# Patient Record
Sex: Male | Born: 1980 | Race: White | Hispanic: No | Marital: Married | Smoking: Current every day smoker
Health system: Southern US, Community
[De-identification: ages and names within clinical notes are randomized; demographics above are authoritative.]

## PROBLEM LIST (undated history)

## (undated) DIAGNOSIS — Z720 Tobacco use: Secondary | ICD-10-CM

## (undated) DIAGNOSIS — Z789 Other specified health status: Secondary | ICD-10-CM

## (undated) HISTORY — PX: NO PAST SURGERIES: SHX2092

---

## 2005-08-16 ENCOUNTER — Emergency Department (HOSPITAL_COMMUNITY): Admission: EM | Admit: 2005-08-16 | Discharge: 2005-08-16 | Payer: Self-pay | Admitting: Emergency Medicine

## 2007-11-15 ENCOUNTER — Emergency Department (HOSPITAL_COMMUNITY): Admission: EM | Admit: 2007-11-15 | Discharge: 2007-11-15 | Payer: Self-pay | Admitting: Emergency Medicine

## 2014-04-26 ENCOUNTER — Inpatient Hospital Stay (HOSPITAL_COMMUNITY)
Admission: EM | Admit: 2014-04-26 | Discharge: 2014-04-30 | DRG: 392 | Disposition: A | Discharge status: Home / Self Care | Payer: Self-pay | Attending: Surgery | Admitting: Surgery

## 2014-04-26 ENCOUNTER — Encounter (HOSPITAL_COMMUNITY): Payer: Self-pay | Admitting: Emergency Medicine

## 2014-04-26 ENCOUNTER — Emergency Department (HOSPITAL_COMMUNITY): Payer: Self-pay

## 2014-04-26 DIAGNOSIS — F1290 Cannabis use, unspecified, uncomplicated: Secondary | ICD-10-CM | POA: Diagnosis present

## 2014-04-26 DIAGNOSIS — F17210 Nicotine dependence, cigarettes, uncomplicated: Secondary | ICD-10-CM | POA: Diagnosis present

## 2014-04-26 DIAGNOSIS — Z720 Tobacco use: Secondary | ICD-10-CM

## 2014-04-26 DIAGNOSIS — D72829 Elevated white blood cell count, unspecified: Secondary | ICD-10-CM | POA: Diagnosis present

## 2014-04-26 DIAGNOSIS — K5720 Diverticulitis of large intestine with perforation and abscess without bleeding: Principal | ICD-10-CM | POA: Diagnosis present

## 2014-04-26 DIAGNOSIS — R109 Unspecified abdominal pain: Secondary | ICD-10-CM

## 2014-04-26 HISTORY — DX: Tobacco use: Z72.0

## 2014-04-26 HISTORY — DX: Other specified health status: Z78.9

## 2014-04-26 LAB — COMPREHENSIVE METABOLIC PANEL
ALBUMIN: 4.2 g/dL (ref 3.5–5.2)
ALK PHOS: 70 U/L (ref 39–117)
ALT: 24 U/L (ref 0–53)
AST: 16 U/L (ref 0–37)
Anion gap: 12 (ref 5–15)
BILIRUBIN TOTAL: 1.9 mg/dL — AB (ref 0.3–1.2)
BUN: 14 mg/dL (ref 6–23)
CHLORIDE: 103 meq/L (ref 96–112)
CO2: 24 mmol/L (ref 19–32)
CREATININE: 0.9 mg/dL (ref 0.50–1.35)
Calcium: 9.6 mg/dL (ref 8.4–10.5)
GFR calc non Af Amer: >90 mL/min (ref >90)
GLUCOSE: 104 mg/dL — AB (ref 70–99)
Potassium: 3.8 mmol/L (ref 3.5–5.1)
SODIUM: 139 mmol/L (ref 135–145)
TOTAL PROTEIN: 8 g/dL (ref 6.0–8.3)

## 2014-04-26 LAB — CBC WITH DIFFERENTIAL/PLATELET
Basophils Absolute: 0 10*3/uL (ref 0.0–0.1)
Basophils Relative: 0 % (ref 0–1)
Eosinophils Absolute: 0 10*3/uL (ref 0.0–0.7)
Eosinophils Relative: 0 % (ref 0–5)
HCT: 48.7 % (ref 39.0–52.0)
HEMOGLOBIN: 17.2 g/dL — AB (ref 13.0–17.0)
Lymphocytes Relative: 6 % — ABNORMAL LOW (ref 12–46)
Lymphs Abs: 1.4 10*3/uL (ref 0.7–4.0)
MCH: 30.7 pg (ref 26.0–34.0)
MCHC: 35.3 g/dL (ref 30.0–36.0)
MCV: 87 fL (ref 78.0–100.0)
Monocytes Absolute: 1.3 10*3/uL — ABNORMAL HIGH (ref 0.1–1.0)
Monocytes Relative: 6 % (ref 3–12)
NEUTROS ABS: 19.7 10*3/uL — AB (ref 1.7–7.7)
NEUTROS PCT: 88 % — AB (ref 43–77)
Platelets: 291 10*3/uL (ref 150–400)
RBC: 5.6 MIL/uL (ref 4.22–5.81)
RDW: 12.1 % (ref 11.5–15.5)
WBC: 22.5 10*3/uL — AB (ref 4.0–10.5)

## 2014-04-26 LAB — URINALYSIS, ROUTINE W REFLEX MICROSCOPIC
Glucose, UA: NEGATIVE mg/dL
HGB URINE DIPSTICK: NEGATIVE
KETONES UR: 40 mg/dL — AB
LEUKOCYTES UA: NEGATIVE
NITRITE: NEGATIVE
PH: 5.5 (ref 5.0–8.0)
Protein, ur: NEGATIVE mg/dL
Specific Gravity, Urine: >1.046 — ABNORMAL HIGH (ref 1.005–1.030)
UROBILINOGEN UA: 1 mg/dL (ref 0.0–1.0)

## 2014-04-26 LAB — LIPASE, BLOOD: Lipase: 20 U/L (ref 11–59)

## 2014-04-26 MED ORDER — CIPROFLOXACIN IN D5W 400 MG/200ML IV SOLN
400.0000 mg | Freq: Once | INTRAVENOUS | Status: AC
  Administered 2014-04-26: 400 mg via INTRAVENOUS
  Filled 2014-04-26: qty 200

## 2014-04-26 MED ORDER — MORPHINE SULFATE 4 MG/ML IJ SOLN: 4.0000 mg | INTRAMUSCULAR | Status: DC | PRN

## 2014-04-26 MED ORDER — HYDROMORPHONE HCL 1 MG/ML IJ SOLN
1.0000 mg | Freq: Once | INTRAMUSCULAR | Status: AC
  Administered 2014-04-26: 1 mg via INTRAVENOUS
  Filled 2014-04-26: qty 1

## 2014-04-26 MED ORDER — METRONIDAZOLE IN NACL 5-0.79 MG/ML-% IV SOLN
500.0000 mg | Freq: Once | INTRAVENOUS | Status: AC
  Administered 2014-04-26: 500 mg via INTRAVENOUS
  Filled 2014-04-26: qty 100

## 2014-04-26 MED ORDER — IOHEXOL 300 MG/ML  SOLN
50.0000 mL | Freq: Once | INTRAMUSCULAR | Status: AC | PRN
  Administered 2014-04-26: 50 mL via ORAL

## 2014-04-26 MED ORDER — ONDANSETRON HCL 4 MG/2ML IJ SOLN
4.0000 mg | Freq: Four times a day (QID) | INTRAMUSCULAR | Status: DC | PRN
Start: 2014-04-26 — End: 2014-04-30

## 2014-04-26 MED ORDER — CIPROFLOXACIN IN D5W 400 MG/200ML IV SOLN: 400.0000 mg | Freq: Two times a day (BID) | INTRAVENOUS | Status: DC

## 2014-04-26 MED ORDER — PANTOPRAZOLE SODIUM 40 MG IV SOLR
40.0000 mg | Freq: Every day | INTRAVENOUS | Status: DC
  Administered 2014-04-26 – 2014-04-28 (×3): 40 mg via INTRAVENOUS
  Filled 2014-04-26 (×4): qty 40

## 2014-04-26 MED ORDER — HYDROMORPHONE HCL 1 MG/ML IJ SOLN
1.0000 mg | INTRAMUSCULAR | Status: DC | PRN
  Administered 2014-04-26 – 2014-04-27 (×4): 1 mg via INTRAVENOUS
  Filled 2014-04-26 (×5): qty 1

## 2014-04-26 MED ORDER — ONDANSETRON HCL 4 MG/2ML IJ SOLN
4.0000 mg | Freq: Once | INTRAMUSCULAR | Status: AC
  Administered 2014-04-26: 4 mg via INTRAVENOUS
  Filled 2014-04-26: qty 2

## 2014-04-26 MED ORDER — SODIUM CHLORIDE 0.9 % IV BOLUS (SEPSIS)
1000.0000 mL | Freq: Once | INTRAVENOUS | Status: AC
  Administered 2014-04-26: 1000 mL via INTRAVENOUS

## 2014-04-26 MED ORDER — CIPROFLOXACIN IN D5W 400 MG/200ML IV SOLN
400.0000 mg | Freq: Two times a day (BID) | INTRAVENOUS | Status: DC
  Administered 2014-04-27 – 2014-04-30 (×7): 400 mg via INTRAVENOUS
  Filled 2014-04-26 (×7): qty 200

## 2014-04-26 MED ORDER — METRONIDAZOLE IN NACL 5-0.79 MG/ML-% IV SOLN
500.0000 mg | Freq: Three times a day (TID) | INTRAVENOUS | Status: DC
  Administered 2014-04-27 – 2014-04-30 (×10): 500 mg via INTRAVENOUS
  Filled 2014-04-26 (×11): qty 100

## 2014-04-26 MED ORDER — IOHEXOL 300 MG/ML  SOLN
100.0000 mL | Freq: Once | INTRAMUSCULAR | Status: AC | PRN
  Administered 2014-04-26: 100 mL via INTRAVENOUS

## 2014-04-26 MED ORDER — METRONIDAZOLE IN NACL 5-0.79 MG/ML-% IV SOLN: 500.0000 mg | Freq: Three times a day (TID) | INTRAVENOUS | Status: DC

## 2014-04-26 MED ORDER — POTASSIUM CHLORIDE IN NACL 20-0.9 MEQ/L-% IV SOLN
INTRAVENOUS | Status: DC
  Administered 2014-04-26 – 2014-04-29 (×4): via INTRAVENOUS
  Filled 2014-04-26 (×12): qty 1000

## 2014-04-26 MED ORDER — ENOXAPARIN SODIUM 40 MG/0.4ML ~~LOC~~ SOLN
40.0000 mg | Freq: Every day | SUBCUTANEOUS | Status: DC
  Administered 2014-04-26 – 2014-04-29 (×4): 40 mg via SUBCUTANEOUS
  Filled 2014-04-26 (×6): qty 0.4

## 2014-04-26 NOTE — Progress Notes (Signed)
Utilization Review completed.  Dondi Aime RN CM  

## 2014-04-26 NOTE — ED Provider Notes (Signed)
CSN: [id number]     Arrival date & time 04/26/14  1149 History   First MD Initiated Contact with Patient 04/26/14 1456     Chief Complaint  Patient presents with  . Abdominal Pain     (Consider location/radiation/quality/duration/timing/severity/associated sxs/prior Treatment) Patient is a 34 y.o. male presenting with abdominal pain. The history is provided by the patient.  Abdominal Pain Associated symptoms: nausea   Associated symptoms: no chest pain, no chills, no constipation, no cough, no diarrhea, no dysuria, no fever, no shortness of breath, no sore throat and no vomiting   pt c/o lower abd pain for the past 2 days. Constant. Dull. Moderate-sever. Non radiating. Worse w movement and coughing. Decreased appetite, nausea. No vomiting. Had small bm today. No constipation. Denies fever or chills. No hx similar symptoms in past. No prior abd surgery.   History reviewed. No pertinent past medical history. History reviewed. No pertinent past surgical history. History reviewed. No pertinent family history. History  Substance Use Topics  . Smoking status: Current Every Day Smoker -- 0.01 packs/day    Types: Cigarettes  . Smokeless tobacco: Not on file  . Alcohol Use: Yes     Comment: occasionally    Review of Systems  Constitutional: Negative for fever and chills.  HENT: Negative for sore throat.   Eyes: Negative for redness.  Respiratory: Negative for cough and shortness of breath.   Cardiovascular: Negative for chest pain.  Gastrointestinal: Positive for nausea and abdominal pain. Negative for vomiting, diarrhea and constipation.  Endocrine: Negative for polyuria.  Genitourinary: Negative for dysuria, flank pain, scrotal swelling and testicular pain.  Musculoskeletal: Negative for back pain and neck pain.  Skin: Negative for rash.  Neurological: Negative for headaches.  Hematological: Does not bruise/bleed easily.  Psychiatric/Behavioral: Negative for confusion.       Allergies  Review of patient's allergies indicates no known allergies.  Home Medications   Prior to Admission medications   Not on File   BP 126/78 mmHg  Pulse 96  Temp(Src) 98.9 F (37.2 C) (Oral)  Resp 16  SpO2 97% Physical Exam  Constitutional: He is oriented to person, place, and time. He appears well-developed and well-nourished. No distress.  HENT:  Head: Atraumatic.  Mouth/Throat: Oropharynx is clear and moist.  Eyes: Conjunctivae are normal. No scleral icterus.  Neck: Neck supple. No tracheal deviation present.  Cardiovascular: Normal rate, regular rhythm, normal heart sounds and intact distal pulses.  Exam reveals no gallop and no friction rub.   No murmur heard. Pulmonary/Chest: Effort normal and breath sounds normal. No accessory muscle usage. No respiratory distress.  Abdominal: Soft. He exhibits no distension and no mass. There is tenderness. There is no rebound and no guarding.  Moderate suprapubic, and RLQ tenderness. Guards.   Genitourinary:  No scrotal or testicular pain, swelling or tenderness.   Musculoskeletal: Normal range of motion. He exhibits no edema or tenderness.  Neurological: He is alert and oriented to person, place, and time.  Skin: Skin is warm and dry. No rash noted. He is not diaphoretic.  Psychiatric: He has a normal mood and affect.  Nursing note and vitals reviewed.   ED Course  Procedures (including critical care time) Labs Review   Results for orders placed or performed during the hospital encounter of 04/26/14  CBC with Differential  Result Value Ref Range   WBC 22.5 (H) 4.0 - 10.5 K/uL   RBC 5.60 4.22 - 5.81 MIL/uL   Hemoglobin 17.2 (H) 13.0 - 17.0  g/dL   HCT 45.448.7 09.839.0 - 11.952.0 %   MCV 87.0 78.0 - 100.0 fL   MCH 30.7 26.0 - 34.0 pg   MCHC 35.3 30.0 - 36.0 g/dL   RDW 14.712.1 82.911.5 - 56.215.5 %   Platelets 291 150 - 400 K/uL   Neutrophils Relative % 88 (H) 43 - 77 %   Neutro Abs 19.7 (H) 1.7 - 7.7 K/uL   Lymphocytes Relative  6 (L) 12 - 46 %   Lymphs Abs 1.4 0.7 - 4.0 K/uL   Monocytes Relative 6 3 - 12 %   Monocytes Absolute 1.3 (H) 0.1 - 1.0 K/uL   Eosinophils Relative 0 0 - 5 %   Eosinophils Absolute 0.0 0.0 - 0.7 K/uL   Basophils Relative 0 0 - 1 %   Basophils Absolute 0.0 0.0 - 0.1 K/uL  Comprehensive metabolic panel  Result Value Ref Range   Sodium 139 135 - 145 mmol/L   Potassium 3.8 3.5 - 5.1 mmol/L   Chloride 103 96 - 112 mEq/L   CO2 24 19 - 32 mmol/L   Glucose, Bld 104 (H) 70 - 99 mg/dL   BUN 14 6 - 23 mg/dL   Creatinine, Ser 1.300.90 0.50 - 1.35 mg/dL   Calcium 9.6 8.4 - 86.510.5 mg/dL   Total Protein 8.0 6.0 - 8.3 g/dL   Albumin 4.2 3.5 - 5.2 g/dL   AST 16 0 - 37 U/L   ALT 24 0 - 53 U/L   Alkaline Phosphatase 70 39 - 117 U/L   Total Bilirubin 1.9 (H) 0.3 - 1.2 mg/dL   GFR calc non Af Amer >90 >90 mL/min   GFR calc Af Amer >90 >90 mL/min   Anion gap 12 5 - 15  Lipase, blood  Result Value Ref Range   Lipase 20 11 - 59 U/L   Ct Abdomen Pelvis W Contrast  04/26/2014   CLINICAL DATA:  Left lower quadrant intermittent abdominal pain began yesterday morning. Symptoms worse today. No nausea, vomiting, diarrhea. When he coughs pain radiates to the rectum.  EXAM: CT ABDOMEN AND PELVIS WITH CONTRAST  TECHNIQUE: Multidetector CT imaging of the abdomen and pelvis was performed using the standard protocol following bolus administration of intravenous contrast.  CONTRAST:  50mL OMNIPAQUE IOHEXOL 300 MG/ML SOLN, 100mL OMNIPAQUE IOHEXOL 300 MG/ML SOLN  COMPARISON:  None.  FINDINGS: Lower chest: Lung bases are unremarkable.  Heart size is normal.  Upper abdomen: No focal abnormality identified within the liver, spleen, pancreas, or adrenal glands. Lower pole cyst is identified the left kidney. Right kidney has a normal appearance. The gallbladder is present.  Gastrointestinal tract: The stomach and small bowel loops are normal in appearance. The appendix is well seen and has a normal appearance. Numerous colonic  diverticula are present. There is inflammatory stranding within the sigmoid colon. Probable focal perforation is identified adjacent to the sigmoid colon, consistent with acute diverticulitis. No associated abscess.  Pelvis: Urinary bladder, prostate, and seminal vesicles have a normal appearance.  Retroperitoneum: No retroperitoneal or mesenteric adenopathy.  Abdominal wall: Unremarkable.  Osseous structures: No suspicious lytic or blastic lesions are identified.  IMPRESSION: 1. Findings consistent with sigmoid diverticulitis and focal perforation. No associated abscess. 2. Left renal cyst. 3. Normal appendix.   Electronically Signed   By: Rosalie GumsBeth  Brown M.D.   On: 04/26/2014 17:14      MDM   Iv ns bolus. Labs.  Dilaudid 1 mg iv. zofran iv.  Ct.  Reviewed nursing notes and  prior charts for additional history.   Ct results noted. Discussed w pt.    cipro and flagyl iv.  gen surgery consulted. Admit.  Recheck pt, more comfortable. bp normal. Pain improved.     Suzi Roots, MD 04/26/14 [phone]

## 2014-04-26 NOTE — ED Notes (Signed)
Pt c/o severe LLQ intermittent abdominal pain onset yesterday morning, worsening today. Denies n/v/diarrhea. Pt states that when he coughs, pain radiates to rectum.

## 2014-04-26 NOTE — Progress Notes (Signed)
  CARE MANAGEMENT ED NOTE 04/26/2014  Patient:  Skyles,Dresean   Account Number:  [id number]  Date Initiated:  04/26/2014  Documentation initiated by:  Radford PaxFERRERO,Kie Calvin  Subjective/Objective Assessment:   Patient presents to Ed with abdominal pain mainly in  left lower quadrant     Subjective/Objective Assessment Detail:     Action/Plan:   Action/Plan Detail:   Anticipated DC Date:       Status Recommendation to Physician:   Result of Recommendation:    Other ED Services  Consult Working Plan    DC Planning Services  Other  PCP issues    Choice offered to / List presented to:            Status of service:  Completed, signed off  ED Comments:   ED Comments Detail:  EDCM spoke to patient at bedside. Patient confirms he does not have a pcp or insurance [place]. EDCM provide patient with [facility] to [facility], informed patient of services there and walk in times.  EDCM also provided patient with list of pcps who accept self pay patients, list of discount pharmacies and websites needymeds.org and GoodRX.com for medication assistance, phone number to inquire about the orange card, phone number to inquire about Mediciad, phone number to inquire about the Affordable Care Act, financial resources in the community such as local churches, [employer], urban ministries, and dental assistance for uninsured patients. 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  Patient reports he has two children who are currently receiving Medicaid.  Patient thankful for resources.  No further EDCM needs at this time.

## 2014-04-26 NOTE — H&P (Signed)
Howard Hernandez is an 34 y.o. male.   Chief Complaint: Left lower quadrant abdominal pain HPI: This is very pleasant gentleman who presents with a 2 day history of left lower quadrant abdominal pain. The patient reports it started yesterday gradually in the left quadrant. He had not had a bowel movement several days. He reports that the pain got acutely worse and much sharper with some cramping exacerbations so he presented to the emergency department. He has not passed any flatus today. He has had slight nausea secondary to the pain but no emesis. He is now more comfortable. He has no previous history of abdominal pain and is otherwise healthy.  History reviewed. No pertinent past medical history.  History reviewed. No pertinent past surgical history.  History reviewed. No pertinent family history. Social History:  reports that he has been smoking Cigarettes.  He has been smoking about 0.01 packs per day. He does not have any smokeless tobacco history on file. He reports that he drinks alcohol. He reports that he uses illicit drugs (Marijuana).  Allergies: No Known Allergies   (Not in a hospital admission)  Results for orders placed or performed during the hospital encounter of 04/26/14 (from the past 48 hour(s))  CBC with Differential     Status: Abnormal   Collection Time: 04/26/14 12:22 PM  Result Value Ref Range   WBC 22.5 (H) 4.0 - 10.5 K/uL   RBC 5.60 4.22 - 5.81 MIL/uL   Hemoglobin 17.2 (H) 13.0 - 17.0 g/dL   HCT 48.7 39.0 - 52.0 %   MCV 87.0 78.0 - 100.0 fL   MCH 30.7 26.0 - 34.0 pg   MCHC 35.3 30.0 - 36.0 g/dL   RDW 12.1 11.5 - 15.5 %   Platelets 291 150 - 400 K/uL   Neutrophils Relative % 88 (H) 43 - 77 %   Neutro Abs 19.7 (H) 1.7 - 7.7 K/uL   Lymphocytes Relative 6 (L) 12 - 46 %   Lymphs Abs 1.4 0.7 - 4.0 K/uL   Monocytes Relative 6 3 - 12 %   Monocytes Absolute 1.3 (H) 0.1 - 1.0 K/uL   Eosinophils Relative 0 0 - 5 %   Eosinophils Absolute 0.0 0.0 - 0.7 K/uL   Basophils  Relative 0 0 - 1 %   Basophils Absolute 0.0 0.0 - 0.1 K/uL  Comprehensive metabolic panel     Status: Abnormal   Collection Time: 04/26/14 12:22 PM  Result Value Ref Range   Sodium 139 135 - 145 mmol/L    Comment: Please note change in reference range.   Potassium 3.8 3.5 - 5.1 mmol/L    Comment: Please note change in reference range.   Chloride 103 96 - 112 mEq/L   CO2 24 19 - 32 mmol/L   Glucose, Bld 104 (H) 70 - 99 mg/dL   BUN 14 6 - 23 mg/dL   Creatinine, Ser 0.90 0.50 - 1.35 mg/dL   Calcium 9.6 8.4 - 10.5 mg/dL   Total Protein 8.0 6.0 - 8.3 g/dL   Albumin 4.2 3.5 - 5.2 g/dL   AST 16 0 - 37 U/L   ALT 24 0 - 53 U/L   Alkaline Phosphatase 70 39 - 117 U/L   Total Bilirubin 1.9 (H) 0.3 - 1.2 mg/dL   GFR calc non Af Amer >90 >90 mL/min   GFR calc Af Amer >90 >90 mL/min    Comment: (NOTE) The eGFR has been calculated using the CKD EPI equation. This calculation has  not been validated in all clinical situations. eGFR's persistently <90 mL/min signify possible Chronic Kidney Disease.    Anion gap 12 5 - 15  Lipase, blood     Status: None   Collection Time: 04/26/14 12:22 PM  Result Value Ref Range   Lipase 20 11 - 59 U/L   Ct Abdomen Pelvis W Contrast  04/26/2014   CLINICAL DATA:  Left lower quadrant intermittent abdominal pain began yesterday morning. Symptoms worse today. No nausea, vomiting, diarrhea. When he coughs pain radiates to the rectum.  EXAM: CT ABDOMEN AND PELVIS WITH CONTRAST  TECHNIQUE: Multidetector CT imaging of the abdomen and pelvis was performed using the standard protocol following bolus administration of intravenous contrast.  CONTRAST:  51m OMNIPAQUE IOHEXOL 300 MG/ML SOLN, 1061mOMNIPAQUE IOHEXOL 300 MG/ML SOLN  COMPARISON:  None.  FINDINGS: Lower chest: Lung bases are unremarkable.  Heart size is normal.  Upper abdomen: No focal abnormality identified within the liver, spleen, pancreas, or adrenal glands. Lower pole cyst is identified the left kidney. Right  kidney has a normal appearance. The gallbladder is present.  Gastrointestinal tract: The stomach and small bowel loops are normal in appearance. The appendix is well seen and has a normal appearance. Numerous colonic diverticula are present. There is inflammatory stranding within the sigmoid colon. Probable focal perforation is identified adjacent to the sigmoid colon, consistent with acute diverticulitis. No associated abscess.  Pelvis: Urinary bladder, prostate, and seminal vesicles have a normal appearance.  Retroperitoneum: No retroperitoneal or mesenteric adenopathy.  Abdominal wall: Unremarkable.  Osseous structures: No suspicious lytic or blastic lesions are identified.  IMPRESSION: 1. Findings consistent with sigmoid diverticulitis and focal perforation. No associated abscess. 2. Left renal cyst. 3. Normal appendix.   Electronically Signed   By: BeShon Hale.D.   On: 04/26/2014 17:14    Review of Systems  All other systems reviewed and are negative.   Blood pressure 126/78, pulse 96, temperature 98.9 F (37.2 C), temperature source Oral, resp. rate 16, SpO2 97 %. Physical Exam  Constitutional: He is oriented to person, place, and time. He appears well-developed and well-nourished. No distress.  HENT:  Head: Normocephalic and atraumatic.  Right Ear: External ear normal.  Left Ear: External ear normal.  Nose: Nose normal.  Mouth/Throat: No oropharyngeal exudate.  Eyes: Conjunctivae are normal. Pupils are equal, round, and reactive to light. Right eye exhibits no discharge. Left eye exhibits no discharge. No scleral icterus.  Neck: Normal range of motion. No tracheal deviation present.  Cardiovascular: Regular rhythm, normal heart sounds and intact distal pulses.   No murmur heard. Respiratory: Effort normal and breath sounds normal. No respiratory distress. He has no wheezes.  GI: Soft. Bowel sounds are normal. He exhibits no distension. There is tenderness.  Moderate left lower  quadrant tenderness with guarding. The rest of the abdomen is soft and nontender  Musculoskeletal: Normal range of motion. He exhibits no edema.  Neurological: He is alert and oriented to person, place, and time.  Skin: Skin is warm and dry. No rash noted. He is not diaphoretic. No erythema.  Psychiatric: His behavior is normal. Judgment normal.     Assessment/Plan Acute sigmoid diverticulitis with possible focal microperforation  I discussed this with the patient in detail. Plan will be to admit him to the hospital for IV antibiotics, bowel rest, and frequent physical examinations. Hopefully, this will improve without the need for surgical intervention. If he acutely worsens, he will car exporter laparotomy. He understands and agrees  with the admission.  Rakiyah Esch A 04/26/2014, 7:17 PM

## 2014-04-27 LAB — BASIC METABOLIC PANEL
Anion gap: 6 (ref 5–15)
BUN: 12 mg/dL (ref 6–23)
CALCIUM: 8.8 mg/dL (ref 8.4–10.5)
CHLORIDE: 106 meq/L (ref 96–112)
CO2: 28 mmol/L (ref 19–32)
Creatinine, Ser: 0.84 mg/dL (ref 0.50–1.35)
GFR calc Af Amer: >90 mL/min (ref >90)
GLUCOSE: 103 mg/dL — AB (ref 70–99)
Potassium: 4.2 mmol/L (ref 3.5–5.1)
Sodium: 140 mmol/L (ref 135–145)

## 2014-04-27 LAB — CBC
HCT: 41.9 % (ref 39.0–52.0)
HEMOGLOBIN: 14 g/dL (ref 13.0–17.0)
MCH: 29.7 pg (ref 26.0–34.0)
MCHC: 33.4 g/dL (ref 30.0–36.0)
MCV: 88.8 fL (ref 78.0–100.0)
PLATELETS: 227 10*3/uL (ref 150–400)
RBC: 4.72 MIL/uL (ref 4.22–5.81)
RDW: 12.1 % (ref 11.5–15.5)
WBC: 9.8 10*3/uL (ref 4.0–10.5)

## 2014-04-27 MED ORDER — HYDROMORPHONE HCL 1 MG/ML IJ SOLN
1.0000 mg | INTRAMUSCULAR | Status: DC | PRN
  Administered 2014-04-27 – 2014-04-29 (×7): 1 mg via INTRAVENOUS
  Filled 2014-04-27 (×7): qty 1

## 2014-04-27 MED ORDER — MORPHINE SULFATE 4 MG/ML IJ SOLN: 4.0000 mg | INTRAMUSCULAR | Status: DC | PRN

## 2014-04-27 MED ORDER — HYDROMORPHONE HCL 1 MG/ML IJ SOLN: 1.0000 mg | INTRAMUSCULAR | Status: DC | PRN

## 2014-04-27 NOTE — Progress Notes (Signed)
Patient ID: Howard Hernandez, male   DOB: 1980/06/19, 34 y.o.   MRN: [id number]    Subjective: Pt feels about the same as when he got admitted.  Feels like his pain is slightly less "sharp"  Objective: Vital signs in last 24 hours: Temp:  [97.8 F (36.6 C)-98.9 F (37.2 C)] 98 F (36.7 C) (01/12 0425) Pulse Rate:  [56-96] 56 (01/12 0425) Resp:  [16-20] 16 (01/12 0425) BP: (97-126)/(46-96) 100/54 mmHg (01/12 0425) SpO2:  [97 %-100 %] 99 % (01/12 0425) Last BM Date: 04/24/14  Intake/Output from previous day: 01/11 0701 - 01/12 0700 In: 1350 [I.V.:1350] Out: 300 [Urine:300] Intake/Output this shift:    PE: Abd: soft, tender throughout his lower abdomen with some voluntary guarding, +BS Heart: regular Lungs: CTAB  Lab Results:   Recent Labs  04/26/14 1222 04/27/14 0418  WBC 22.5* 9.8  HGB 17.2* 14.0  HCT 48.7 41.9  PLT 291 227   BMET  Recent Labs  04/26/14 1222 04/27/14 0418  NA 139 140  K 3.8 4.2  CL 103 106  CO2 24 28  GLUCOSE 104* 103*  BUN 14 12  CREATININE 0.90 0.84  CALCIUM 9.6 8.8   PT/INR No results for input(s): LABPROT, INR in the last 72 hours. CMP     Component Value Date/Time   NA 140 04/27/2014 0418   K 4.2 04/27/2014 0418   CL 106 04/27/2014 0418   CO2 28 04/27/2014 0418   GLUCOSE 103* 04/27/2014 0418   BUN 12 04/27/2014 0418   CREATININE 0.84 04/27/2014 0418   CALCIUM 8.8 04/27/2014 0418   PROT 8.0 04/26/2014 1222   ALBUMIN 4.2 04/26/2014 1222   AST 16 04/26/2014 1222   ALT 24 04/26/2014 1222   ALKPHOS 70 04/26/2014 1222   BILITOT 1.9* 04/26/2014 1222   GFRNONAA >90 04/27/2014 0418   GFRAA >90 04/27/2014 0418   Lipase     Component Value Date/Time   LIPASE 20 04/26/2014 1222       Studies/Results: Ct Abdomen Pelvis W Contrast  04/26/2014   CLINICAL DATA:  Left lower quadrant intermittent abdominal pain began yesterday morning. Symptoms worse today. No nausea, vomiting, diarrhea. When he coughs pain radiates to the rectum.   EXAM: CT ABDOMEN AND PELVIS WITH CONTRAST  TECHNIQUE: Multidetector CT imaging of the abdomen and pelvis was performed using the standard protocol following bolus administration of intravenous contrast.  CONTRAST:  50mL OMNIPAQUE IOHEXOL 300 MG/ML SOLN, OMNIPAQUE IOHEXOL 300 MG/ML SOLN  COMPARISON:  None.  FINDINGS: Lower chest: Lung bases are unremarkable.  Heart size is normal.  Upper abdomen: No focal abnormality identified within the liver, spleen, pancreas, or adrenal glands. Lower pole cyst is identified the left kidney. Right kidney has a normal appearance. The gallbladder is present.  Gastrointestinal tract: The stomach and small bowel loops are normal in appearance. The appendix is well seen and has a normal appearance. Numerous colonic diverticula are present. There is inflammatory stranding within the sigmoid colon. Probable focal perforation is identified adjacent to the sigmoid colon, consistent with acute diverticulitis. No associated abscess.  Pelvis: Urinary bladder, prostate, and seminal vesicles have a normal appearance.  Retroperitoneum: No retroperitoneal or mesenteric adenopathy.  Abdominal wall: Unremarkable.  Osseous structures: No suspicious lytic or blastic lesions are identified.  IMPRESSION: 1. Findings consistent with sigmoid diverticulitis and focal perforation. No associated abscess. 2. Left renal cyst. 3. Normal appendix.   Electronically Signed   By: Rosalie Gums M.D.   On: 04/26/2014 17:14  Anti-infectives: Anti-infectives    Start     Dose/Rate Route Frequency Ordered Stop   04/27/14 0600  ciprofloxacin (CIPRO) IVPB 400 mg     400 mg200 mL/hr over 60 Minutes Intravenous Every 12 hours 04/26/14 1951     04/27/14 0400  metroNIDAZOLE (FLAGYL) IVPB 500 mg     500 mg100 mL/hr over 60 Minutes Intravenous Every 8 hours 04/26/14 1952     04/26/14 1845  ciprofloxacin (CIPRO) IVPB 400 mg  Status:  Discontinued     400 mg200 mL/hr over 60 Minutes Intravenous Every 12 hours  04/26/14 1831 04/26/14 1951   04/26/14 1845  metroNIDAZOLE (FLAGYL) IVPB 500 mg  Status:  Discontinued     500 mg100 mL/hr over 60 Minutes Intravenous Every 8 hours 04/26/14 1831 04/26/14 1952   04/26/14 1815  ciprofloxacin (CIPRO) IVPB 400 mg     400 mg200 mL/hr over 60 Minutes Intravenous  Once 04/26/14 1807 04/26/14 1944   04/26/14 1815  metroNIDAZOLE (FLAGYL) IVPB 500 mg     500 mg100 mL/hr over 60 Minutes Intravenous  Once 04/26/14 1807 04/26/14 2113       Assessment/Plan  1. Diverticulitis with microperforation 2. Leukocytosis, resolved  Plan: 1. The patient just got admitted several hours ago.  No real clinical improvement yet.  Cont NPO today along with IVFs and IV abx therapy.   2. Follow labs, although WBC has normalized already today 3. Will likely need repeat CT scan in 3-4 days if not improving. 4. D1 Cipro/Flagyl   LOS: 1 day    Jetaun Colbath E 04/27/2014, 7:38 AM Pager: [phone]

## 2014-04-28 ENCOUNTER — Encounter (HOSPITAL_COMMUNITY): Payer: Self-pay | Admitting: Surgery

## 2014-04-28 DIAGNOSIS — Z720 Tobacco use: Secondary | ICD-10-CM

## 2014-04-28 HISTORY — DX: Tobacco use: Z72.0

## 2014-04-28 LAB — BASIC METABOLIC PANEL
Anion gap: 10 (ref 5–15)
BUN: 12 mg/dL (ref 6–23)
CO2: 26 mmol/L (ref 19–32)
CREATININE: 0.91 mg/dL (ref 0.50–1.35)
Calcium: 8.9 mg/dL (ref 8.4–10.5)
Chloride: 104 mEq/L (ref 96–112)
Glucose, Bld: 83 mg/dL (ref 70–99)
Potassium: 3.8 mmol/L (ref 3.5–5.1)
Sodium: 140 mmol/L (ref 135–145)

## 2014-04-28 LAB — CBC
HCT: 41.2 % (ref 39.0–52.0)
Hemoglobin: 13.6 g/dL (ref 13.0–17.0)
MCH: 29.1 pg (ref 26.0–34.0)
MCHC: 33 g/dL (ref 30.0–36.0)
MCV: 88 fL (ref 78.0–100.0)
PLATELETS: 225 10*3/uL (ref 150–400)
RBC: 4.68 MIL/uL (ref 4.22–5.81)
RDW: 11.9 % (ref 11.5–15.5)
WBC: 6.9 10*3/uL (ref 4.0–10.5)

## 2014-04-28 MED ORDER — OXYCODONE-ACETAMINOPHEN 5-325 MG PO TABS: 1.0000 | ORAL_TABLET | ORAL | Status: DC | PRN

## 2014-04-28 MED ORDER — NICOTINE 14 MG/24HR TD PT24
14.0000 mg | MEDICATED_PATCH | Freq: Every day | TRANSDERMAL | Status: DC
  Administered 2014-04-28 – 2014-04-30 (×3): 14 mg via TRANSDERMAL
  Filled 2014-04-28 (×3): qty 1

## 2014-04-28 NOTE — Progress Notes (Signed)
Subjective: Feel better, but still has pain LLQ.  It was more diffuse before.  Still hurts to move around some also.  He is a smoker and ask for nicotine patch.  He is trying to quit.   Objective: Vital signs in last 24 hours: Temp:  [97.8 F (36.6 C)-98.7 F (37.1 C)] 98.1 F (36.7 C) (01/13 0545) Pulse Rate:  [51-57] 57 (01/13 0545) Resp:  [16-18] 16 (01/13 0545) BP: (108-125)/(53-79) 112/53 mmHg (01/13 0545) SpO2:  [98 %-100 %] 100 % (01/13 0545) Weight:  [85.3 kg (188 lb 0.8 oz)] 85.3 kg (188 lb 0.8 oz) (01/12 2215) Last BM Date: 04/24/14 Afebrile, VSS Labs OK Intake/Output from previous day: 01/12 0701 - 01/13 0700 In: 2665.5 [I.V.:2365.5; IV Piggyback:300] Out: -  Intake/Output this shift:    General appearance: alert, cooperative and no distress GI: soft, sore, pain mostly LLQ, much improved from admit.  Lab Results:   Recent Labs  04/27/14 0418 04/28/14 0403  WBC 9.8 6.9  HGB 14.0 13.6  HCT 41.9 41.2  PLT 227 225    BMET  Recent Labs  04/27/14 0418 04/28/14 0403  NA 140 140  K 4.2 3.8  CL 106 104  CO2 28 26  GLUCOSE 103* 83  BUN 12 12  CREATININE 0.84 0.91  CALCIUM 8.8 8.9   PT/INR No results for input(s): LABPROT, INR in the last 72 hours.   Recent Labs Lab 04/26/14 1222  AST 16  ALT 24  ALKPHOS 70  BILITOT 1.9*  PROT 8.0  ALBUMIN 4.2     Lipase     Component Value Date/Time   LIPASE 20 04/26/2014 1222     Studies/Results: Ct Abdomen Pelvis W Contrast  04/26/2014   CLINICAL DATA:  Left lower quadrant intermittent abdominal pain began yesterday morning. Symptoms worse today. No nausea, vomiting, diarrhea. When he coughs pain radiates to the rectum.  EXAM: CT ABDOMEN AND PELVIS WITH CONTRAST  TECHNIQUE: Multidetector CT imaging of the abdomen and pelvis was performed using the standard protocol following bolus administration of intravenous contrast.  CONTRAST:  50mL OMNIPAQUE IOHEXOL 300 MG/ML SOLN, 100mL OMNIPAQUE IOHEXOL 300  MG/ML SOLN  COMPARISON:  None.  FINDINGS: Lower chest: Lung bases are unremarkable.  Heart size is normal.  Upper abdomen: No focal abnormality identified within the liver, spleen, pancreas, or adrenal glands. Lower pole cyst is identified the left kidney. Right kidney has a normal appearance. The gallbladder is present.  Gastrointestinal tract: The stomach and small bowel loops are normal in appearance. The appendix is well seen and has a normal appearance. Numerous colonic diverticula are present. There is inflammatory stranding within the sigmoid colon. Probable focal perforation is identified adjacent to the sigmoid colon, consistent with acute diverticulitis. No associated abscess.  Pelvis: Urinary bladder, prostate, and seminal vesicles have a normal appearance.  Retroperitoneum: No retroperitoneal or mesenteric adenopathy.  Abdominal wall: Unremarkable.  Osseous structures: No suspicious lytic or blastic lesions are identified.  IMPRESSION: 1. Findings consistent with sigmoid diverticulitis and focal perforation. No associated abscess. 2. Left renal cyst. 3. Normal appendix.   Electronically Signed   By: Rosalie GumsBeth  Brown M.D.   On: 04/26/2014 17:14    Medications: . ciprofloxacin  400 mg Intravenous Q12H  . enoxaparin (LOVENOX) injection  40 mg Subcutaneous QHS  . metronidazole  500 mg Intravenous Q8H  . pantoprazole (PROTONIX) IV  40 mg Intravenous QHS   . 0.9 % NaCl with KCl 20 mEq / L 125 mL/hr at 04/28/14 0300  Prior to Admission medications   Not on File    Assessment/Plan Acute sigmoid diverticulitis with possible focal microperforation  Tobacco use  Plan:  Clear liquids, nicotine patch, continue IV antibiotics.    LOS: 2 days    Howard Hernandez 04/28/2014

## 2014-04-29 MED ORDER — SACCHAROMYCES BOULARDII 250 MG PO CAPS
250.0000 mg | ORAL_CAPSULE | Freq: Two times a day (BID) | ORAL | Status: DC
  Administered 2014-04-29 – 2014-04-30 (×3): 250 mg via ORAL
  Filled 2014-04-29 (×4): qty 1

## 2014-04-29 NOTE — Progress Notes (Signed)
  Subjective: He looks good. Some pain early AM and took something.  Tolerating clears.  Objective: Vital signs in last 24 hours: Temp:  [97.9 F (36.6 C)-98.2 F (36.8 C)] 98 F (36.7 C) (01/14 0435) Pulse Rate:  [56-70] 61 (01/14 0435) Resp:  [16-20] 16 (01/14 0435) BP: (113-134)/(63-79) 113/63 mmHg (01/14 0435) SpO2:  [100 %] 100 % (01/14 0435) Last BM Date: 04/24/14 1422 PO Afebrile, VSS Labs OK yesterday Intake/Output from previous day: 01/13 0701 - 01/14 0700 In: 3897 [P.O.:1422; I.V.:2375; IV Piggyback:100] Out: -  Intake/Output this shift:    General appearance: alert, cooperative and no distress GI: soft, almost no pain this AM.  no distension on exam, +BS  Lab Results:   Recent Labs  04/27/14 0418 04/28/14 0403  WBC 9.8 6.9  HGB 14.0 13.6  HCT 41.9 41.2  PLT 227 225    BMET  Recent Labs  04/27/14 0418 04/28/14 0403  NA 140 140  K 4.2 3.8  CL 106 104  CO2 28 26  GLUCOSE 103* 83  BUN 12 12  CREATININE 0.84 0.91  CALCIUM 8.8 8.9   PT/INR No results for input(s): LABPROT, INR in the last 72 hours.   Recent Labs Lab 04/26/14 1222  AST 16  ALT 24  ALKPHOS 70  BILITOT 1.9*  PROT 8.0  ALBUMIN 4.2     Lipase     Component Value Date/Time   LIPASE 20 04/26/2014 1222     Studies/Results: No results found.  Medications: . ciprofloxacin  400 mg Intravenous Q12H  . enoxaparin (LOVENOX) injection  40 mg Subcutaneous QHS  . metronidazole  500 mg Intravenous Q8H  . nicotine  14 mg Transdermal Daily  . pantoprazole (PROTONIX) IV  40 mg Intravenous QHS    Assessment/Plan Acute sigmoid diverticulitis with possible focal microperforation Tobacco use   Plan:  Full liquids today, continue IV antibiotics for now.  If he does well and labs Ok switch to PO antibiotics and soft diet in AM.  Possible home in AM.  i am going to stop PPI, add probiotic.l  Labs in AM.    LOS: 3 days    Shakoya Gilmore 04/29/2014

## 2014-04-30 LAB — CBC
HCT: 46.1 % (ref 39.0–52.0)
HEMOGLOBIN: 16 g/dL (ref 13.0–17.0)
MCH: 29.7 pg (ref 26.0–34.0)
MCHC: 34.7 g/dL (ref 30.0–36.0)
MCV: 85.7 fL (ref 78.0–100.0)
PLATELETS: 322 10*3/uL (ref 150–400)
RBC: 5.38 MIL/uL (ref 4.22–5.81)
RDW: 11.6 % (ref 11.5–15.5)
WBC: 6.8 10*3/uL (ref 4.0–10.5)

## 2014-04-30 MED ORDER — CIPROFLOXACIN HCL 500 MG PO TABS: 500.0000 mg | ORAL_TABLET | Freq: Two times a day (BID) | ORAL | Status: AC

## 2014-04-30 MED ORDER — CIPROFLOXACIN HCL 500 MG PO TABS
500.0000 mg | ORAL_TABLET | Freq: Two times a day (BID) | ORAL | Status: DC
  Filled 2014-04-30 (×2): qty 1

## 2014-04-30 MED ORDER — METRONIDAZOLE 500 MG PO TABS
500.0000 mg | ORAL_TABLET | Freq: Three times a day (TID) | ORAL | Status: DC
  Administered 2014-04-30: 500 mg via ORAL
  Filled 2014-04-30 (×3): qty 1

## 2014-04-30 MED ORDER — METRONIDAZOLE 500 MG PO TABS: 500.0000 mg | ORAL_TABLET | Freq: Three times a day (TID) | ORAL | Status: AC

## 2014-04-30 NOTE — Discharge Summary (Signed)
Patient ID: Karsten Fellsdam Hendryx MRN: [id number] DOB/AGE: [birth year] 34 y.o.  Admit date: 04/26/2014 Discharge date: 04/30/2014  Procedures: none  Consults: None  Reason for Admission: This is very pleasant gentleman who presents with a 2 day history of left lower quadrant abdominal pain. The patient reports it started yesterday gradually in the left quadrant. He had not had a bowel movement several days. He reports that the pain got acutely worse and much sharper with some cramping exacerbations so he presented to the emergency department. He has not passed any flatus today. He has had slight nausea secondary to the pain but no emesis. He is now more comfortable. He has no previous history of abdominal pain and is otherwise healthy.  Admission Diagnoses:  1. Acute sigmoid diverticulitis with possible microperforation  Hospital Course:  The patient was admitted and placed on Cipro and Flagyl.  He was treated with bowel rest initially.  His WBC normalized from 22.5 on admission to 9K on HD 1.  On HD 2, he was beginning to feel better and he was given clear liquids.  His diet was then able to be advanced as tolerates.  He remained AF with a normal WBC.  His abx were transitioned to oral on HD 4. He was tolerating a low fiber diet with minimal pain.  He was felt stable for dc home.  He is informed he will need to obtain a GI doctor and have a colonoscopy completed in 6-8 weeks.    PE: Abd: soft, minimally tender in LLQ, +BS, ND Heart: regular Lungs: CTAB  Discharge Diagnoses:  Principal Problem:   Diverticulitis of colon with microperforation Active Problems:   Tobacco abuse   Discharge Medications:   Medication List    TAKE these medications        ciprofloxacin 500 MG tablet  Commonly known as:  CIPRO  Take 1 tablet (500 mg total) by mouth 2 (two) times daily.     metroNIDAZOLE 500 MG tablet  Commonly known as:  FLAGYL  Take 1 tablet (500 mg total) by mouth every 8 (eight) hours.         Discharge Instructions:     Follow-up Information    Follow up with Birr,STEVEN C., MD. Schedule an appointment as soon as possible for a visit in 3 weeks.   Specialty:  General Surgery   Contact information:   [address] [place] [id number] [phone]       Signed: Letha CapeOSBORNE,Wafa Martes E 04/30/2014, 9:05 AM

## 2014-04-30 NOTE — Discharge Instructions (Signed)
STOP SMOKING!  We strongly recommend that you stop smoking.  Smoking increases the risk of surgery including infection in the form of an open wound, pus formation, abscess, hernia at an incision on the abdomen, etc.  You have an increased risk of other MAJOR complications such as stroke, heart attack, forming clots in the leg and/or lungs, and death.    Smoking Cessation Quitting smoking is important to your health and has many advantages. However, it is not always easy to quit since nicotine is a very addictive drug. Often times, people try 3 times or more before being able to quit. This document explains the best ways for you to prepare to quit smoking. Quitting takes hard work and a lot of effort, but you can do it. ADVANTAGES OF QUITTING SMOKING  You will live longer, feel better, and live better.  Your body will feel the impact of quitting smoking almost immediately.  Within 20 minutes, blood pressure decreases. Your pulse returns to its normal level.  After 8 hours, carbon monoxide levels in the blood return to normal. Your oxygen level increases.  After 24 hours, the chance of having a heart attack starts to decrease. Your breath, hair, and body stop smelling like smoke.  After 48 hours, damaged nerve endings begin to recover. Your sense of taste and smell improve.  After 72 hours, the body is virtually free of nicotine. Your bronchial tubes relax and breathing becomes easier.  After 2 to 12 weeks, lungs can hold more air. Exercise becomes easier and circulation improves.  The risk of having a heart attack, stroke, cancer, or lung disease is greatly reduced.  After 1 year, the risk of coronary heart disease is cut in half.  After 5 years, the risk of stroke falls to the same as a nonsmoker.  After 10 years, the risk of lung cancer is cut in half and the risk of other cancers decreases significantly.  After 15 years, the risk of coronary heart disease drops, usually to the level  of a nonsmoker.  If you are pregnant, quitting smoking will improve your chances of having a healthy baby.  The people you live with, especially any children, will be healthier.  You will have extra money to spend on things other than cigarettes. QUESTIONS TO THINK ABOUT BEFORE ATTEMPTING TO QUIT You may want to talk about your answers with your caregiver.  Why do you want to quit?  If you tried to quit in the past, what helped and what did not?  What will be the most difficult situations for you after you quit? How will you plan to handle them?  Who can help you through the tough times? Your family? Friends? A caregiver?  What pleasures do you get from smoking? What ways can you still get pleasure if you quit? Here are some questions to ask your caregiver:  How can you help me to be successful at quitting?  What medicine do you think would be best for me and how should I take it?  What should I do if I need more help?  What is smoking withdrawal like? How can I get information on withdrawal? GET READY  Set a quit date.  Change your environment by getting rid of all cigarettes, ashtrays, matches, and lighters in your home, car, or work. Do not let people smoke in your home.  Review your past attempts to quit. Think about what worked and what did not. GET SUPPORT AND ENCOURAGEMENT You have a  better chance of being successful if you have help. You can get support in many ways.  Tell your family, friends, and co-workers that you are going to quit and need their support. Ask them not to smoke around you.  Get individual, group, or telephone counseling and support. Programs are available at [employer] and health centers. Call your local health department for information about programs in your area.  Spiritual beliefs and practices may help some smokers quit.  Download a "quit meter" on your computer to keep track of quit statistics, such as how long you have gone without  smoking, cigarettes not smoked, and money saved.  Get a self-help book about quitting smoking and staying off of tobacco. [employer] yourself from urges to smoke. Talk to someone, go for a walk, or occupy your time with a task.  Change your normal routine. Take a different route to work. Drink tea instead of coffee. Eat breakfast in a different place.  Reduce your stress. Take a hot bath, exercise, or read a book.  Plan something enjoyable to do every day. Reward yourself for not smoking.  Explore interactive web-based programs that specialize in helping you quit. GET MEDICINE AND USE IT CORRECTLY Medicines can help you stop smoking and decrease the urge to smoke. Combining medicine with the above behavioral methods and support can greatly increase your chances of successfully quitting smoking.  Nicotine replacement therapy helps deliver nicotine to your body without the negative effects and risks of smoking. Nicotine replacement therapy includes nicotine gum, lozenges, inhalers, nasal sprays, and skin patches. Some may be available over-the-counter and others require a prescription.  Antidepressant medicine helps people abstain from smoking, but how this works is unknown. This medicine is available by prescription.  Nicotinic receptor partial agonist medicine simulates the effect of nicotine in your brain. This medicine is available by prescription. Ask your caregiver for advice about which medicines to use and how to use them based on your health history. Your caregiver will tell you what side effects to look out for if you choose to be on a medicine or therapy. Carefully read the information on the package. Do not use any other product containing nicotine while using a nicotine replacement product.  RELAPSE OR DIFFICULT SITUATIONS Most relapses occur within the first 3 months after quitting. Do not be discouraged if you start smoking again. Remember, most  people try several times before finally quitting. You may have symptoms of withdrawal because your body is used to nicotine. You may crave cigarettes, be irritable, feel very hungry, cough often, get headaches, or have difficulty concentrating. The withdrawal symptoms are only temporary. They are strongest when you first quit, but they will go away within 10 14 days. To reduce the chances of relapse, try to:  Avoid drinking alcohol. Drinking lowers your chances of successfully quitting.  Reduce the amount of caffeine you consume. Once you quit smoking, the amount of caffeine in your body increases and can give you symptoms, such as a rapid heartbeat, sweating, and anxiety.  Avoid smokers because they can make you want to smoke.  Do not let weight gain distract you. Many smokers will gain weight when they quit, usually less than 10 pounds. Eat a healthy diet and stay active. You can always lose the weight gained after you quit.  Find ways to improve your mood other than smoking. FOR MORE INFORMATION  www.smokefree.gov    While it can be one of the  most difficult things to do, the [place] has programs to help you stop.  Consider talking with your primary care physician about options.  Also, Smoking Cessation classes are available through the [facility]:  The smoking cessation program is a proven-effective program from the American Lung Association. The program is available for anyone 42 and older who currently smokes. The program lasts for 7 weeks and is 8 sessions. Each class will be approximately 1 1/2 hours. The program is every Tuesday.  All classes are 12-1:30pm and same location.  Event Location Information:  Location: [facility] 2nd Floor Conference Room 2-037; located next to [address]: [facility] Entrance into the [facility] is adjacent to the [facility] main entrance.  The conference room is located on the 2nd floor.  Parking Instructions: Visitor parking is adjacent to [employer] main entrance and the Cancer Center    A smoking cessation program is also offered through the [facility]. Register online at [email or web address] or call [phone] for more information.   Tobacco cessation counseling is available at [facility]. Call [phone] for a free appointment.   Tobacco cessation classes also are available through the [facility] in [place]. For information, call [phone].   The Patient Education Network features videos on tobacco cessation. Please consult your listings in the center of this book to find instructions on how to access this resource.   If you want more information, ask your nurse.        Diverticulitis Diverticulitis is inflammation or infection of small pouches in your colon that form when you have a condition called diverticulosis. The pouches in your colon are called diverticula. Your colon, or large intestine, is where water is absorbed and stool is formed. Complications of diverticulitis can include: Bleeding. Severe infection. Severe pain. Perforation of your colon. Obstruction of your colon. CAUSES  Diverticulitis is caused by bacteria. Diverticulitis happens when stool becomes trapped in diverticula. This allows bacteria to grow in the diverticula, which can lead to inflammation and infection. RISK FACTORS People with diverticulosis are at risk for diverticulitis. Eating a diet that does not include enough fiber from fruits and vegetables may make diverticulitis more likely to develop. SYMPTOMS  Symptoms of diverticulitis may include: Abdominal pain and tenderness. The pain is normally located on the left side of the abdomen, but may occur in other areas. Fever and chills. Bloating. Cramping. Nausea. Vomiting. Constipation. Diarrhea. Blood in your  stool. DIAGNOSIS  Your health care provider will ask you about your medical history and do a physical exam. You may need to have tests done because many medical conditions can cause the same symptoms as diverticulitis. Tests may include: Blood tests. Urine tests. Imaging tests of the abdomen, including X-rays and CT scans. When your condition is under control, your health care provider may recommend that you have a colonoscopy. A colonoscopy can show how severe your diverticula are and whether something else is causing your symptoms. TREATMENT  Most cases of diverticulitis are mild and can be treated at home. Treatment may include: Taking over-the-counter pain medicines. Following a clear liquid diet. Taking antibiotic medicines by mouth for 7-10 days. More severe cases may be treated at a hospital. Treatment may include: Not eating or drinking. Taking prescription pain medicine. Receiving antibiotic medicines through an IV tube. Receiving fluids and nutrition through an IV tube. Surgery. HOME  CARE INSTRUCTIONS  Follow your health care provider's instructions carefully. Follow a full liquid diet or other diet as directed by your health care provider. After your symptoms improve, your health care provider may tell you to change your diet. He or she may recommend you eat a high-fiber diet. Fruits and vegetables are good sources of fiber. Fiber makes it easier to pass stool. Take fiber supplements or probiotics as directed by your health care provider. Only take medicines as directed by your health care provider. Keep all your follow-up appointments. SEEK MEDICAL CARE IF:  Your pain does not improve. You have a hard time eating food. Your bowel movements do not return to normal. SEEK IMMEDIATE MEDICAL CARE IF:  Your pain becomes worse. Your symptoms do not get better. Your symptoms suddenly get worse. You have a fever. You have repeated vomiting. You have bloody or black, tarry  stools. MAKE SURE YOU:  Understand these instructions. Will watch your condition. Will get help right away if you are not doing well or get worse. Document Released: 01/10/2005 Document Revised: 04/07/2013 Document Reviewed: 02/25/2013 [facility] Patient Information 2015 [place], [place]. This information is not intended to replace advice given to you by your health care provider. Make sure you discuss any questions you have with your health care provider.    Low-Fiber Diet Fiber is found in fruits, vegetables, and whole grains. A low-fiber diet restricts fibrous foods that are not digested in the small intestine. A diet containing about 10-15 grams of fiber per day is considered low fiber. Low-fiber diets may be used to:  Promote healing and rest the bowel during intestinal flare-ups.  Prevent blockage of a partially obstructed or narrowed gastrointestinal tract.  Reduce fecal weight and volume.  Slow the movement of feces. You may be on a low-fiber diet as a transitional diet following surgery, after an injury (trauma), or because of a short (acute) or lifelong (chronic) illness. Your health care provider will determine the length of time you need to stay on this diet.  WHAT DO I NEED TO KNOW ABOUT A LOW-FIBER DIET? Always check the fiber content on the packaging's Nutrition Facts label, especially on foods from the grains list. Ask your dietitian if you have questions about specific foods that are related to your condition, especially if the food is not listed below. In general, a low-fiber food will have less than 2 g of fiber. WHAT FOODS CAN I EAT? Grains All breads and crackers made with white flour. Sweet rolls, doughnuts, waffles, pancakes, [place] toast, bagels. Pretzels, Melba toast, zwieback. Well-cooked cereals, such as cornmeal, farina, or cream cereals. Dry cereals that do not contain whole grains, fruit, or nuts, such as refined corn, wheat, rice, and oat cereals. Potatoes  prepared any way without skins, plain pastas and noodles, refined white rice. Use white flour for baking and making sauces. Use allowed list of grains for casseroles, dumplings, and puddings.  Vegetables Strained tomato and vegetable juices. Fresh lettuce, cucumber, spinach. Well-cooked (no skin or pulp) or canned vegetables, such as asparagus, bean sprouts, beets, carrots, green beans, mushrooms, potatoes, pumpkin, spinach, yellow squash, tomato sauce/puree, turnips, yams, and zucchini. Keep servings limited to  cup.  Fruits All fruit juices except prune juice. Cooked or canned fruits without skin and seeds, such as applesauce, apricots, cherries, fruit cocktail, grapefruit, grapes, mandarin oranges, melons, peaches, pears, pineapple, and plums. Fresh fruits without skin, such as apricots, avocados, bananas, melons, pineapple, nectarines, and peaches. Keep servings limited to  cup  or 1 piece.  Meat and Other Protein Sources Ground or well-cooked tender beef, ham, veal, lamb, pork, or poultry. Eggs, plain cheese. Fish, oysters, shrimp, lobster, and other seafood. Liver, organ meats. Smooth nut butters. Dairy All milk products and alternative dairy substitutes, such as soy, rice, almond, and coconut, not containing added whole nuts, seeds, or added fruit. Beverages Decaf coffee, fruit, and vegetable juices or smoothies (small amounts, with no pulp or skins, and with fruits from allowed list), sports drinks, herbal tea. Condiments Ketchup, mustard, vinegar, cream sauce, cheese sauce, cocoa powder. Spices in moderation, such as allspice, basil, bay leaves, celery powder or leaves, cinnamon, cumin powder, curry powder, ginger, mace, marjoram, onion or garlic powder, oregano, paprika, parsley flakes, ground pepper, rosemary, sage, savory, tarragon, thyme, and turmeric. Sweets and Desserts Plain cakes and cookies, pie made with allowed fruit, pudding, custard, cream pie. Gelatin, fruit, ice, sherbet,  frozen ice pops. Ice cream, ice milk without nuts. Plain hard candy, honey, jelly, molasses, syrup, sugar, chocolate syrup, gumdrops, marshmallows. Limit overall sugar intake.  Fats and Oil Margarine, butter, cream, mayonnaise, salad oils, plain salad dressings made from allowed foods. Choose healthy fats such as olive oil, canola oil, and omega-3 fatty acids (such as found in salmon or tuna) when possible.  Other Bouillon, broth, or cream soups made from allowed foods. Any strained soup. Casseroles or mixed dishes made with allowed foods. The items listed above may not be a complete list of recommended foods or beverages. Contact your dietitian for more options.  WHAT FOODS ARE NOT RECOMMENDED? Grains All whole wheat and whole grain breads and crackers. Multigrains, rye, bran seeds, nuts, or coconut. Cereals containing whole grains, multigrains, bran, coconut, nuts, raisins. Cooked or dry oatmeal, steel-cut oats. Coarse wheat cereals, granola. Cereals advertised as high fiber. Potato skins. Whole grain pasta, wild or brown rice. Popcorn. Coconut flour. Bran, buckwheat, corn bread, multigrains, rye, wheat germ.  Vegetables Fresh, cooked or canned vegetables, such as artichokes, asparagus, beet greens, broccoli, Brussels sprouts, cabbage, celery, cauliflower, corn, eggplant, kale, legumes or beans, okra, peas, and tomatoes. Avoid large servings of any vegetables, especially raw vegetables.  Fruits Fresh fruits, such as apples with or without skin, berries, cherries, figs, grapes, grapefruit, guavas, kiwis, mangoes, oranges, papayas, pears, persimmons, pineapple, and pomegranate. Prune juice and juices with pulp, stewed or dried prunes. Dried fruits, dates, raisins. Fruit seeds or skins. Avoid large servings of all fresh fruits. Meats and Other Protein Sources Tough, fibrous meats with gristle. Chunky nut butter. Cheese made with seeds, nuts, or other foods not recommended. Nuts, seeds, legumes (beans,  including baked beans), dried peas, beans, lentils.  Dairy Yogurt or cheese that contains nuts, seeds, or added fruit.  Beverages Fruit juices with high pulp, prune juice. Caffeinated coffee and teas.  Condiments Coconut, maple syrup, pickles, olives. Sweets and Desserts Desserts, cookies, or candies that contain nuts or coconut, chunky peanut butter, dried fruits. Jams, preserves with seeds, marmalade. Large amounts of sugar and sweets. Any other dessert made with fruits from the not recommended list.  Other Soups made from vegetables that are not recommended or that contain other foods not recommended.  The items listed above may not be a complete list of foods and beverages to avoid. Contact your dietitian for more information. Document Released: 09/22/2001 Document Revised: 04/07/2013 Document Reviewed: 02/23/2013 [facility] Patient Information 2015 [place], [place]. This information is not intended to replace advice given to you by your health care provider. Make sure you discuss any  questions you have with your health care provider.  High-Fiber Diet Fiber is found in fruits, vegetables, and grains. A high-fiber diet encourages the addition of more whole grains, legumes, fruits, and vegetables in your diet. The recommended amount of fiber for adult males is 38 g per day. For adult females, it is 25 g per day. Pregnant and lactating women should get 28 g of fiber per day. If you have a digestive or bowel problem, ask your caregiver for advice before adding high-fiber foods to your diet. Eat a variety of high-fiber foods instead of only a select few type of foods.  PURPOSE  To increase stool bulk.  To make bowel movements more regular to prevent constipation.  To lower cholesterol.  To prevent overeating. WHEN IS THIS DIET USED?  It may be used if you have constipation and hemorrhoids.  It may be used if you have uncomplicated diverticulosis (intestine condition) and irritable bowel  syndrome.  It may be used if you need help with weight management.  It may be used if you want to add it to your diet as a protective measure against atherosclerosis, diabetes, and cancer. SOURCES OF FIBER  Whole-grain breads and cereals.  Fruits, such as apples, oranges, bananas, berries, prunes, and pears.  Vegetables, such as green peas, carrots, sweet potatoes, beets, broccoli, cabbage, spinach, and artichokes.  Legumes, such split peas, soy, lentils.  Almonds. FIBER CONTENT IN FOODS Starches and Grains / Dietary Fiber (g)  Cheerios, 1 cup / 3 g  Corn Flakes cereal, 1 cup / 0.7 g  Rice crispy treat cereal, 1 cup / 0.3 g  Instant oatmeal (cooked),  cup / 2 g  Frosted wheat cereal, 1 cup / 5.1 g  Brown, long-grain rice (cooked), 1 cup / 3.5 g  White, long-grain rice (cooked), 1 cup / 0.6 g  Enriched macaroni (cooked), 1 cup / 2.5 g Legumes / Dietary Fiber (g)  Baked beans (canned, plain, or vegetarian),  cup / 5.2 g  Kidney beans (canned),  cup / 6.8 g  Pinto beans (cooked),  cup / 5.5 g Breads and Crackers / Dietary Fiber (g)  Plain or honey graham crackers, 2 squares / 0.7 g  Saltine crackers, 3 squares / 0.3 g  Plain, salted pretzels, 10 pieces / 1.8 g  Whole-wheat bread, 1 slice / 1.9 g  White bread, 1 slice / 0.7 g  Raisin bread, 1 slice / 1.2 g  Plain bagel, 3 oz / 2 g  Flour tortilla, 1 oz / 0.9 g  Corn tortilla, 1 small / 1.5 g  Hamburger or hotdog bun, 1 small / 0.9 g Fruits / Dietary Fiber (g)  Apple with skin, 1 medium / 4.4 g  Sweetened applesauce,  cup / 1.5 g  Banana,  medium / 1.5 g  Grapes, 10 grapes / 0.4 g  Orange, 1 small / 2.3 g  Raisin, 1.5 oz / 1.6 g  Melon, 1 cup / 1.4 g Vegetables / Dietary Fiber (g)  Green beans (canned),  cup / 1.3 g  Carrots (cooked),  cup / 2.3 g  Broccoli (cooked),  cup / 2.8 g  Peas (cooked),  cup / 4.4 g  Mashed potatoes,  cup / 1.6 g  Lettuce, 1 cup / 0.5 g  Corn  (canned),  cup / 1.6 g  Tomato,  cup / 1.1 g Document Released: 04/02/2005 Document Revised: 10/02/2011 Document Reviewed: 07/05/2011 [employer] Patient Information 2015 [employer]. This information is not intended to  replace advice given to you by your health care provider. Make sure you discuss any questions you have with your health care provider.

## 2016-01-22 ENCOUNTER — Emergency Department (HOSPITAL_COMMUNITY): Payer: Self-pay

## 2016-01-22 ENCOUNTER — Emergency Department (HOSPITAL_COMMUNITY)
Admission: EM | Admit: 2016-01-22 | Discharge: 2016-01-22 | Disposition: A | Discharge status: Home / Self Care | Payer: Self-pay | Attending: Emergency Medicine | Admitting: Emergency Medicine

## 2016-01-22 ENCOUNTER — Encounter (HOSPITAL_COMMUNITY): Payer: Self-pay | Admitting: Oncology

## 2016-01-22 DIAGNOSIS — Y9389 Activity, other specified: Secondary | ICD-10-CM | POA: Insufficient documentation

## 2016-01-22 DIAGNOSIS — F1290 Cannabis use, unspecified, uncomplicated: Secondary | ICD-10-CM | POA: Insufficient documentation

## 2016-01-22 DIAGNOSIS — F17210 Nicotine dependence, cigarettes, uncomplicated: Secondary | ICD-10-CM | POA: Insufficient documentation

## 2016-01-22 DIAGNOSIS — Y999 Unspecified external cause status: Secondary | ICD-10-CM | POA: Insufficient documentation

## 2016-01-22 DIAGNOSIS — S0181XA Laceration without foreign body of other part of head, initial encounter: Secondary | ICD-10-CM | POA: Insufficient documentation

## 2016-01-22 DIAGNOSIS — Y929 Unspecified place or not applicable: Secondary | ICD-10-CM | POA: Insufficient documentation

## 2016-01-22 DIAGNOSIS — Z23 Encounter for immunization: Secondary | ICD-10-CM | POA: Insufficient documentation

## 2016-01-22 DIAGNOSIS — Z79899 Other long term (current) drug therapy: Secondary | ICD-10-CM | POA: Insufficient documentation

## 2016-01-22 MED ORDER — TETANUS-DIPHTHERIA TOXOIDS TD 5-2 LFU IM INJ
0.5000 mL | INJECTION | Freq: Once | INTRAMUSCULAR | Status: AC
  Administered 2016-01-22: 0.5 mL via INTRAMUSCULAR
  Filled 2016-01-22: qty 0.5

## 2016-01-22 MED ORDER — LIDOCAINE-EPINEPHRINE 2 %-1:100000 IJ SOLN: 20.0000 mL | Freq: Once | INTRAMUSCULAR | Status: DC

## 2016-01-22 MED ORDER — HYDROCODONE-ACETAMINOPHEN 5-325 MG PO TABS: 2.0000 | ORAL_TABLET | ORAL | 0 refills | Status: AC | PRN

## 2016-01-22 MED ORDER — LIDOCAINE-EPINEPHRINE (PF) 2 %-1:200000 IJ SOLN
10.0000 mL | Freq: Once | INTRAMUSCULAR | Status: DC
  Filled 2016-01-22: qty 10

## 2016-01-22 NOTE — ED Provider Notes (Signed)
I was requested to perform suture repair for Dr. Freida BusmanAllen.    LACERATION REPAIR Performed by: Fayrene HelperRAN,Kadan Millstein Authorized byFayrene Helper: Niyana Chesbro Consent: Verbal consent obtained. Risks and benefits: risks, benefits and alternatives were discussed Consent given by: patient Patient identity confirmed: provided demographic data Prepped and Draped in normal sterile fashion Wound explored  Laceration Location: L upper forehead  Laceration Length: 4cm  No Foreign Bodies seen or palpated  Anesthesia: local infiltration  Local anesthetic: lidocaine 2% w epinephrine  Anesthetic total: 3 ml  Irrigation method: syringe Amount of cleaning: standard  Skin closure: prolene 5.0  Number of sutures: 7  Technique: simple interrupted  Patient tolerance: Patient tolerated the procedure well with no immediate complications.  LACERATION REPAIR Performed by: Fayrene HelperRAN,Muhammed Teutsch Authorized byFayrene Helper: Pema Thomure Consent: Verbal consent obtained. Risks and benefits: risks, benefits and alternatives were discussed Consent given by: patient Patient identity confirmed: provided demographic data Prepped and Draped in normal sterile fashion Wound explored  Laceration Location: R forehead  Laceration Length: 3cm  No Foreign Bodies seen or palpated  Anesthesia: local infiltration  Local anesthetic: lidocaine 2% w epinephrine  Anesthetic total: 2 ml  Irrigation method: syringe Amount of cleaning: standard  Skin closure: prolene 5.0  Number of sutures: 5  Technique: simple interrupted  Patient tolerance: Patient tolerated the procedure well with no immediate complications.    Fayrene HelperBowie Thorsten Climer, PA-C 01/22/16 0720    Lorre NickAnthony Allen, MD 01/23/16 [phone]

## 2016-01-22 NOTE — ED Notes (Signed)
MD at bedside. 

## 2016-01-22 NOTE — Discharge Instructions (Signed)
Sutures out in 5 days
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
## 2016-01-22 NOTE — ED Notes (Signed)
Patient transported to CT 
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
## 2024-01-29 ENCOUNTER — Emergency Department (HOSPITAL_BASED_OUTPATIENT_CLINIC_OR_DEPARTMENT_OTHER)
Admission: EM | Admit: 2024-01-29 | Discharge: 2024-01-29 | Discharge status: Against Medical Advice | Payer: MEDICAID | Attending: Emergency Medicine | Admitting: Emergency Medicine

## 2024-01-29 ENCOUNTER — Encounter (HOSPITAL_BASED_OUTPATIENT_CLINIC_OR_DEPARTMENT_OTHER): Payer: Self-pay | Admitting: Emergency Medicine

## 2024-01-29 ENCOUNTER — Emergency Department (HOSPITAL_COMMUNITY): Payer: MEDICAID

## 2024-01-29 ENCOUNTER — Other Ambulatory Visit: Payer: Self-pay

## 2024-01-29 DIAGNOSIS — R29898 Other symptoms and signs involving the musculoskeletal system: Secondary | ICD-10-CM

## 2024-01-29 DIAGNOSIS — Z5329 Procedure and treatment not carried out because of patient's decision for other reasons: Secondary | ICD-10-CM | POA: Insufficient documentation

## 2024-01-29 DIAGNOSIS — Z5321 Procedure and treatment not carried out due to patient leaving prior to being seen by health care provider: Secondary | ICD-10-CM

## 2024-01-29 DIAGNOSIS — R519 Headache, unspecified: Secondary | ICD-10-CM | POA: Insufficient documentation

## 2024-01-29 DIAGNOSIS — F17200 Nicotine dependence, unspecified, uncomplicated: Secondary | ICD-10-CM | POA: Insufficient documentation

## 2024-01-29 DIAGNOSIS — M21372 Foot drop, left foot: Secondary | ICD-10-CM | POA: Insufficient documentation

## 2024-01-29 DIAGNOSIS — R531 Weakness: Secondary | ICD-10-CM | POA: Insufficient documentation

## 2024-01-29 LAB — CBC
HCT: 48.4 % (ref 39.0–52.0)
Hemoglobin: 17.5 g/dL — ABNORMAL HIGH (ref 13.0–17.0)
MCH: 33.8 pg (ref 26.0–34.0)
MCHC: 36.2 g/dL — ABNORMAL HIGH (ref 30.0–36.0)
MCV: 93.6 fL (ref 80.0–100.0)
Platelets: 195 K/uL (ref 150–400)
RBC: 5.17 MIL/uL (ref 4.22–5.81)
RDW: 12 % (ref 11.5–15.5)
WBC: 7.6 K/uL (ref 4.0–10.5)
nRBC: 0 % (ref 0.0–0.2)

## 2024-01-29 LAB — APTT: aPTT: 28 s (ref 24–36)

## 2024-01-29 LAB — BASIC METABOLIC PANEL WITH GFR
Anion gap: 16 — ABNORMAL HIGH (ref 5–15)
BUN: 8 mg/dL (ref 6–20)
CO2: 26 mmol/L (ref 22–32)
Calcium: 9.6 mg/dL (ref 8.9–10.3)
Chloride: 100 mmol/L (ref 98–111)
Creatinine, Ser: 0.82 mg/dL (ref 0.61–1.24)
GFR, Estimated: >60 mL/min (ref >60)
Glucose, Bld: 131 mg/dL — ABNORMAL HIGH (ref 70–99)
Potassium: 3.4 mmol/L — ABNORMAL LOW (ref 3.5–5.1)
Sodium: 141 mmol/L (ref 135–145)

## 2024-01-29 LAB — PROTIME-INR
INR: 0.8 (ref 0.8–1.2)
Prothrombin Time: 12 s (ref 11.4–15.2)

## 2024-01-29 LAB — TROPONIN I (HIGH SENSITIVITY)
Troponin I (High Sensitivity): 7 ng/L (ref <18)
Troponin I (High Sensitivity): 6 ng/L (ref <18)

## 2024-01-29 LAB — ETHANOL: Alcohol, Ethyl (B): 310 mg/dL (ref <15)

## 2024-01-29 MED ORDER — LORAZEPAM 2 MG/ML IJ SOLN
1.0000 mg | Freq: Once | INTRAMUSCULAR | Status: AC
  Administered 2024-01-29: 1 mg via INTRAVENOUS
  Filled 2024-01-29: qty 1

## 2024-01-29 NOTE — ED Notes (Signed)
 This rn was in a code stroke and pt told the NT AROUND 1845 that he was leaving. Dr. Francesca  notified at 1900.

## 2024-01-29 NOTE — ED Provider Notes (Addendum)
 Patient from lobby/triage waiting going to MRI. Requesting anti-anxiety medication. No allergies. BP 139/84.    Odell Balls, PA-C 01/29/24 1440  ADDENDUM: while in MRI, ordered while at [facility] and transferred to [facility], he had onset of central chest pain, very mild SOB. He also reports onset of left sided, throbbing sharp headache that starts behind the eye and extends through temporal scalp. History of migraines.   He is in NAD, VSS. EKG reviewed and is without acute change. Troponin added to labs.    Odell Balls, PA-C 01/29/24 1506    Dean Clarity, MD 01/29/24 [phone]

## 2024-01-29 NOTE — ED Notes (Signed)
 Troponin sent.

## 2024-01-29 NOTE — ED Notes (Signed)
 Pt left AMA

## 2024-01-29 NOTE — Discharge Instructions (Addendum)
 Dr. Patsey is the accepting physician at [facility].  Check in to the emergency department and let them know that you are there for an MRI of your brain and lumbar spine being transferred from [facility] emergency department.

## 2024-01-29 NOTE — ED Triage Notes (Signed)
 Patient sent from [facility] for a MRI on his left leg. Patient is limping on arrival. Denies any injury.

## 2024-01-29 NOTE — ED Notes (Signed)
 Report given to the [facility] RN.SABRA

## 2024-01-29 NOTE — ED Triage Notes (Signed)
 Pt caox4 ambulatory c/o L leg weakness stating it started approx Sun or Mon and has been constant since, has affected his ability to walk normal. Also states he has felt an abnormal pressure intermittent in the L side of the head for a few months. Denies speech or visual changes. Denies any recent trauma. No ataxia, speech, or visual abnormalities on arrival to ED, L leg weakness present.

## 2024-01-29 NOTE — ED Notes (Signed)
 Pt sent POV to [facility] ED... Pt and family aware of how, where to go and who to talk to ... Pt was offered a wheelchair to the vehicle and he declined.SABRASABRA

## 2024-01-29 NOTE — ED Provider Notes (Addendum)
 [place] EMERGENCY DEPARTMENT AT [facility] Provider Note   CSN: [id number] Arrival date & time: 01/29/24  1203     Patient presents with: Extremity Weakness   Howard Hernandez is a 43 y.o. male with past medical history significant for previous diverticulitis with perforation, tobacco abuse who presents concern for left leg weakness started approximately Sunday or Monday.  Has been constant since.  He reports that it is affected his ability to walk.  He describes having to lift his leg up into the car.  He reports not being able to lift his foot, having foot drop.  He also endorses some intermittent left side of the head pressure for a few months.  No speech or vision changes.  No recent trauma.  He denies any back pain.  He denies any cancer, IV drug use.  No previous history of similar although he has had some tingling of extremities before, and does endorse having sciatica although it affected his other side.    Extremity Weakness       Prior to Admission medications   Medication Sig Start Date End Date Taking? Authorizing Provider  ciprofloxacin  (CIPRO ) 500 MG tablet Take 1 tablet (500 mg total) by mouth 2 (two) times daily. 04/30/14   Tammy Sor, PA-C  HYDROcodone -acetaminophen  (NORCO/VICODIN) 5-325 MG tablet Take 2 tablets by mouth every 4 (four) hours as needed. 01/22/16   Dasie Faden, MD  metroNIDAZOLE  (FLAGYL ) 500 MG tablet Take 1 tablet (500 mg total) by mouth every 8 (eight) hours. 04/30/14   Tammy Sor, PA-C    Allergies: Patient has no known allergies.    Review of Systems  Musculoskeletal:  Positive for extremity weakness.  All other systems reviewed and are negative.   Updated Vital Signs BP (!) 141/91   Pulse 95   Temp 97.9 F (36.6 C) (Oral)   Resp 12   Ht 6' 2 (1.88 m)   Wt 80.7 kg   SpO2 96%   BMI 22.85 kg/m   Physical Exam Vitals and nursing note reviewed.  Constitutional:      General: He is not in acute distress.    Appearance:  Normal appearance.  HENT:     Head: Normocephalic and atraumatic.  Eyes:     General:        Right eye: No discharge.        Left eye: No discharge.  Cardiovascular:     Rate and Rhythm: Normal rate and regular rhythm.     Heart sounds: No murmur heard.    No friction rub. No gallop.  Pulmonary:     Effort: Pulmonary effort is normal.     Breath sounds: Normal breath sounds.  Abdominal:     General: Bowel sounds are normal.     Palpations: Abdomen is soft.  Skin:    General: Skin is warm and dry.     Capillary Refill: Capillary refill takes less than 2 seconds.  Neurological:     Mental Status: He is alert and oriented to person, place, and time.     Comments: Cranial nerves II through XII grossly intact.  Intact finger-nose, intact heel-to-shin.  Romberg negative, gait normal.  Alert and oriented x3.  Moves all 4 limbs spontaneously, normal coordination.  No pronator drift.  Intact strength 5 out of 5 bilateral upper and lower extremities other than decreased strength 3-4/5 to dorsiflexion of the left lower extremity.    Psychiatric:        Mood and Affect: Mood  normal.        Behavior: Behavior normal.     (all labs ordered are listed, but only abnormal results are displayed) Labs Reviewed  CBC - Abnormal; Notable for the following components:      Result Value   Hemoglobin 17.5 (*)    MCHC 36.2 (*)    All other components within normal limits  BASIC METABOLIC PANEL WITH GFR - Abnormal; Notable for the following components:   Potassium 3.4 (*)    Glucose, Bld 131 (*)    Anion gap 16 (*)    All other components within normal limits  ETHANOL - Abnormal; Notable for the following components:   Alcohol, Ethyl (B) 310 (*)    All other components within normal limits  PROTIME-INR  APTT    EKG: EKG Interpretation Date/Time:  Wednesday January 29 2024 12:16:36 EDT Ventricular Rate:  101 PR Interval:  162 QRS Duration:  96 QT Interval:  378 QTC Calculation: 490 R  Axis:   254  Text Interpretation: Sinus tachycardia Probable left atrial enlargement Left anterior fascicular block Consider right ventricular hypertrophy Borderline prolonged QT interval Confirmed by Yolande Charleston [phone]) on 01/29/2024 1:05:46 PM  Radiology: No results found.   Procedures   Medications Ordered in the ED - No data to display                                  Medical Decision Making Amount and/or Complexity of Data Reviewed Labs: ordered. Radiology: ordered.   This patient is a 43 y.o. male  who presents to the ED for concern of leg weakness.   Differential diagnoses prior to evaluation: The emergent differential diagnosis includes, but is not limited to,  CVA, spinal cord injury, ACS, arrhythmia, syncope, orthostatic hypotension, sepsis, hypoglycemia, hypoxia, electrolyte disturbance, endocrine disorder, anemia, environmental exposure, polypharmacy . This is not an exhaustive differential --overall suspect peripheral nerve/radicular dysfunction from the lumbar spine, versus stroke or other central neurologic abnormality.  Past Medical History / Co-morbidities / Social History: diverticulitis with perforation, tobacco abuse  Additional history: Chart reviewed. Pertinent results include: Denies history of cancer, IV drug use  Physical Exam: Physical exam performed. The pertinent findings include: Vital signs otherwise stable other than borderline tachycardia, pulse around 99-100.  And some hypertension, blood pressure 162/103.  Overall benign neurologic exam other than left-sided foot drop, decreased dorsiflexion on the left.  Endorses having some difficulty with moving the entire left lower extremity spontaneously, but does have overall normal strength and coordination on my exam. Question slight decreased sensation on left lateral.   CBC overall unremarkable other than mildly elevated hemoglobin 17.5, BMP with mild hypokalemia, potassium 3.4, he does have an  anion gap of 16 secondary likely to an ethanol level of 310.  Normal APTT, normal PT/INR.  Difficult to assess whether alcohol intoxication may be playing a role in his symptoms, overall still high clinical suspicion for lumbar radiculopathy versus central neurologic abnormality.  EKG with sinus tachycardia, LAFB, borderline QT prolongation, no acute ST-T changes.  Due to patient's exam being consistent with either focal lumbar radiculopathy or atypical stroke presentation with left leg deficits I do think that patient would benefit from MRI of the brain and lumbar spine.  I spoke with Dr. Patsey with [facility] emergency department who accepts patient in transfer.  He will go POV with his wife.  Patient understands agrees to plan, extensive return precautions  given.  Final diagnoses:  Weakness of left lower extremity    ED Discharge Orders     None          Rosan Sherlean DEL, PA-C 01/29/24 1302    Rosan Sherlean DEL, [place] 01/29/24 1348    Yolande Lamar BROCKS, MD 02/04/24 [phone]

## 2024-01-29 NOTE — ED Provider Notes (Signed)
   ED Course / MDM   Clinical Course as of 01/29/24 1910  Wed Jan 29, 2024  1908 Patient was transferred from outside hospital for left leg weakness.  MRI did show some nerve root compression likely causing some sciatica.  He did have some weakness and foot drop on my exam.  He was able to ambulate.  Labs otherwise notable for elevated alcohol level but patient is clinically sober.  Able to ambulate with a steady gait.  Plan was to discuss further with neurosurgery given his leg weakness however patient apparently eloped and could not be found.  Formal AMA discussion was not obtained [WS]    Clinical Course User Index [WS] Francesca Elsie CROME, MD   Medical Decision Making Amount and/or Complexity of Data Reviewed Labs: ordered. Radiology: ordered.      Francesca Elsie CROME, MD 01/29/24 1910
